# Patient Record
Sex: Female | Born: 1950 | Race: White | Hispanic: No | Marital: Married | Smoking: Never smoker
Health system: Southern US, Community
[De-identification: ages and names within clinical notes are randomized; demographics above are authoritative.]

## PROBLEM LIST (undated history)

## (undated) DIAGNOSIS — E785 Hyperlipidemia, unspecified: Secondary | ICD-10-CM

## (undated) DIAGNOSIS — F419 Anxiety disorder, unspecified: Secondary | ICD-10-CM

## (undated) HISTORY — DX: Hyperlipidemia, unspecified: E78.5

## (undated) HISTORY — DX: Anxiety disorder, unspecified: F41.9

## (undated) HISTORY — PX: HERNIA REPAIR: SHX51

---

## 1997-12-10 ENCOUNTER — Other Ambulatory Visit: Admission: RE | Admit: 1997-12-10 | Discharge: 1997-12-10 | Payer: Self-pay | Admitting: *Deleted

## 1998-06-23 ENCOUNTER — Other Ambulatory Visit: Admission: RE | Admit: 1998-06-23 | Discharge: 1998-06-23 | Payer: Self-pay | Admitting: *Deleted

## 1999-06-08 ENCOUNTER — Other Ambulatory Visit: Admission: RE | Admit: 1999-06-08 | Discharge: 1999-06-08 | Payer: Self-pay | Admitting: *Deleted

## 2000-06-07 ENCOUNTER — Other Ambulatory Visit: Admission: RE | Admit: 2000-06-07 | Discharge: 2000-06-07 | Payer: Self-pay | Admitting: *Deleted

## 2000-06-23 ENCOUNTER — Encounter: Admission: RE | Admit: 2000-06-23 | Discharge: 2000-06-23 | Payer: Self-pay | Admitting: *Deleted

## 2000-06-23 ENCOUNTER — Encounter: Payer: Self-pay | Admitting: *Deleted

## 2000-08-11 ENCOUNTER — Ambulatory Visit (HOSPITAL_COMMUNITY): Admission: RE | Admit: 2000-08-11 | Discharge: 2000-08-11 | Payer: Self-pay | Admitting: Gastroenterology

## 2001-06-20 ENCOUNTER — Other Ambulatory Visit: Admission: RE | Admit: 2001-06-20 | Discharge: 2001-06-20 | Payer: Self-pay | Admitting: *Deleted

## 2002-08-21 ENCOUNTER — Other Ambulatory Visit: Admission: RE | Admit: 2002-08-21 | Discharge: 2002-08-21 | Payer: Self-pay | Admitting: Obstetrics and Gynecology

## 2003-09-15 ENCOUNTER — Other Ambulatory Visit: Admission: RE | Admit: 2003-09-15 | Discharge: 2003-09-15 | Payer: Self-pay | Admitting: Obstetrics and Gynecology

## 2003-10-13 ENCOUNTER — Encounter: Admission: RE | Admit: 2003-10-13 | Discharge: 2003-10-13 | Payer: Self-pay | Admitting: Obstetrics and Gynecology

## 2005-09-16 ENCOUNTER — Encounter: Admission: RE | Admit: 2005-09-16 | Discharge: 2005-09-16 | Payer: Self-pay | Admitting: Obstetrics and Gynecology

## 2006-11-09 ENCOUNTER — Encounter: Admission: RE | Admit: 2006-11-09 | Discharge: 2006-11-09 | Payer: Self-pay | Admitting: Obstetrics and Gynecology

## 2007-11-12 ENCOUNTER — Encounter: Admission: RE | Admit: 2007-11-12 | Discharge: 2007-11-12 | Payer: Self-pay | Admitting: Obstetrics and Gynecology

## 2008-11-12 ENCOUNTER — Encounter: Admission: RE | Admit: 2008-11-12 | Discharge: 2008-11-12 | Payer: Self-pay | Admitting: Obstetrics and Gynecology

## 2009-11-18 ENCOUNTER — Encounter: Admission: RE | Admit: 2009-11-18 | Discharge: 2009-11-18 | Payer: Self-pay | Admitting: Obstetrics and Gynecology

## 2010-10-25 ENCOUNTER — Other Ambulatory Visit: Payer: Self-pay | Admitting: Obstetrics and Gynecology

## 2010-10-25 DIAGNOSIS — Z1231 Encounter for screening mammogram for malignant neoplasm of breast: Secondary | ICD-10-CM

## 2010-11-22 ENCOUNTER — Ambulatory Visit
Admission: RE | Admit: 2010-11-22 | Discharge: 2010-11-22 | Disposition: A | Discharge status: Home / Self Care | Payer: BC Managed Care – PPO | Source: Ambulatory Visit | Attending: Obstetrics and Gynecology | Admitting: Obstetrics and Gynecology

## 2010-11-22 DIAGNOSIS — Z1231 Encounter for screening mammogram for malignant neoplasm of breast: Secondary | ICD-10-CM

## 2010-11-26 ENCOUNTER — Other Ambulatory Visit: Payer: Self-pay | Admitting: Obstetrics and Gynecology

## 2010-12-31 NOTE — Procedures (Signed)
[facility]  Patient:    Kim Boyle, Kim Boyle                   MRN: [id number] Proc. Date: 08/11/00 Attending:  Verlin Grills, M.D. CC:         Sung Amabile. Roslyn Smiling, M.D.   Procedure Report  PROCEDURE:  Colonoscopy.  REFERRING PHYSICIAN:  Dr. Elissa Hefty.  INDICATION FOR PROCEDURE:  Kim Boyle (DOB: 06/15/1951) is a 60 year old female. Her mother was diagnosed with and died of colon cancer at approximately age 69. Kim Boyle is due for a surveillance colonoscopy with polypectomy to prevent colon cancer.  Kim Boyle viewed our colonoscopy education film in my office. I discussed with her the complications associated with colonoscopy and polypectomy including a 08/998 risk of bleeding and 11/998 risk of intestinal perforation requiring surgical repair. Kim Boyle has signed the operative permit.  ALLERGIES:  NOVOCAINE, CODEINE, PENICILLIN, GUIAFENESIN.  CHRONIC MEDICATIONS:  Tylenol, Xanax, calcium, magnesium, zinc, vitamin B6, vitamin E, acidophilus, Monistat.  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 Infectious mononucleosis.  FAMILY HISTORY:  Father age 63 is in good health. Mother died age 24 with colon cancer. Brother age 59 and sister age 56.  SOCIAL HISTORY:  Kim Boyle is married. Her 62 year old husband is in good health. Her sons ages 26 and 33 are in excellent health.  ENDOSCOPIST:  Verlin Grills, M.D.  PREMEDICATION:  Fentanyl 75 mcg, Versed 9 mg.  ENDOSCOPE:  Olympus pediatric colonoscope.  DESCRIPTION OF PROCEDURE:  After obtaining informed consent, the patient was placed in the left lateral decubitus position. I administered intravenous fentanyl and intravenous Versed to achieve conscious sedation for the procedure. The patients cardiac rhythm, oxygen saturation and blood pressure were monitored throughout the procedure and documented in the medical  record.  Anal inspection was normal. Digital rectal exam was normal. The Olympus pediatric video colonoscope was introduced into the rectum and under direct vision advanced to the cecum as identified by a normal appearing ileocecal valve. The ileocecal valve was intubated and the distal ileum inspected. Colonic preparation for the exam today was excellent.  RECTUM:  Normal.  SIGMOID COLON/DESCENDING COLON:  Normal.  SPLENIC FLEXURE:  Normal.  TRANSVERSE COLON:  Normal.  HEPATIC FLEXURE:  Normal.  ASCENDING COLON:  Normal.  CECUM/ILEOCECAL VALVE:  Normal.  DISTAL ILEUM:  Normal.  ASSESSMENT:  Normal proctocolonoscopy to the cecum with intubation of the ileocecal valve and distal ileal inspection.  RECOMMENDATIONS:  Repeat surveillance colonoscopy in 5 years. DD:  08/11/00 TD:  08/11/00 Job: [id number]

## 2011-01-25 ENCOUNTER — Other Ambulatory Visit: Payer: BC Managed Care – PPO

## 2011-02-04 ENCOUNTER — Ambulatory Visit
Admission: RE | Admit: 2011-02-04 | Discharge: 2011-02-04 | Disposition: A | Discharge status: Home / Self Care | Payer: BC Managed Care – PPO | Source: Ambulatory Visit | Attending: Obstetrics and Gynecology | Admitting: Obstetrics and Gynecology

## 2011-10-20 ENCOUNTER — Other Ambulatory Visit: Payer: Self-pay | Admitting: Obstetrics and Gynecology

## 2011-10-20 DIAGNOSIS — Z1231 Encounter for screening mammogram for malignant neoplasm of breast: Secondary | ICD-10-CM

## 2011-11-23 ENCOUNTER — Ambulatory Visit
Admission: RE | Admit: 2011-11-23 | Discharge: 2011-11-23 | Disposition: A | Discharge status: Home / Self Care | Payer: BC Managed Care – PPO | Source: Ambulatory Visit | Attending: Obstetrics and Gynecology | Admitting: Obstetrics and Gynecology

## 2011-11-23 ENCOUNTER — Ambulatory Visit: Payer: BC Managed Care – PPO

## 2011-11-23 DIAGNOSIS — Z1231 Encounter for screening mammogram for malignant neoplasm of breast: Secondary | ICD-10-CM

## 2012-10-16 ENCOUNTER — Other Ambulatory Visit: Payer: Self-pay

## 2012-10-16 DIAGNOSIS — Z1231 Encounter for screening mammogram for malignant neoplasm of breast: Secondary | ICD-10-CM

## 2012-11-28 ENCOUNTER — Ambulatory Visit
Admission: RE | Admit: 2012-11-28 | Discharge: 2012-11-28 | Disposition: A | Discharge status: Home / Self Care | Payer: BC Managed Care – PPO | Source: Ambulatory Visit

## 2012-11-28 DIAGNOSIS — Z1231 Encounter for screening mammogram for malignant neoplasm of breast: Secondary | ICD-10-CM

## 2012-12-04 ENCOUNTER — Other Ambulatory Visit: Payer: Self-pay | Admitting: Obstetrics and Gynecology

## 2012-12-04 DIAGNOSIS — M810 Age-related osteoporosis without current pathological fracture: Secondary | ICD-10-CM

## 2013-02-04 ENCOUNTER — Other Ambulatory Visit: Payer: BC Managed Care – PPO

## 2013-02-12 ENCOUNTER — Ambulatory Visit
Admission: RE | Admit: 2013-02-12 | Discharge: 2013-02-12 | Disposition: A | Discharge status: Home / Self Care | Payer: BC Managed Care – PPO | Source: Ambulatory Visit | Attending: Obstetrics and Gynecology | Admitting: Obstetrics and Gynecology

## 2013-02-12 DIAGNOSIS — M810 Age-related osteoporosis without current pathological fracture: Secondary | ICD-10-CM

## 2013-12-13 ENCOUNTER — Other Ambulatory Visit: Payer: Self-pay

## 2013-12-13 DIAGNOSIS — Z1231 Encounter for screening mammogram for malignant neoplasm of breast: Secondary | ICD-10-CM

## 2013-12-23 ENCOUNTER — Ambulatory Visit: Payer: BC Managed Care – PPO

## 2013-12-24 ENCOUNTER — Ambulatory Visit: Payer: BC Managed Care – PPO

## 2013-12-25 ENCOUNTER — Ambulatory Visit
Admission: RE | Admit: 2013-12-25 | Discharge: 2013-12-25 | Disposition: A | Discharge status: Home / Self Care | Payer: BC Managed Care – PPO | Source: Ambulatory Visit

## 2013-12-25 DIAGNOSIS — Z1231 Encounter for screening mammogram for malignant neoplasm of breast: Secondary | ICD-10-CM

## 2015-02-06 ENCOUNTER — Other Ambulatory Visit: Payer: Self-pay | Admitting: Obstetrics and Gynecology

## 2015-02-06 DIAGNOSIS — M810 Age-related osteoporosis without current pathological fracture: Secondary | ICD-10-CM

## 2015-02-17 ENCOUNTER — Ambulatory Visit
Admission: RE | Admit: 2015-02-17 | Discharge: 2015-02-17 | Disposition: A | Discharge status: Home / Self Care | Payer: BC Managed Care – PPO | Source: Ambulatory Visit | Attending: Obstetrics and Gynecology | Admitting: Obstetrics and Gynecology

## 2015-02-17 DIAGNOSIS — M810 Age-related osteoporosis without current pathological fracture: Secondary | ICD-10-CM

## 2017-02-16 ENCOUNTER — Other Ambulatory Visit: Payer: Self-pay | Admitting: Obstetrics and Gynecology

## 2017-02-16 DIAGNOSIS — M810 Age-related osteoporosis without current pathological fracture: Secondary | ICD-10-CM

## 2017-02-21 ENCOUNTER — Ambulatory Visit
Admission: RE | Admit: 2017-02-21 | Discharge: 2017-02-21 | Disposition: A | Discharge status: Home / Self Care | Payer: BC Managed Care – PPO | Source: Ambulatory Visit | Attending: Obstetrics and Gynecology | Admitting: Obstetrics and Gynecology

## 2017-02-21 DIAGNOSIS — M810 Age-related osteoporosis without current pathological fracture: Secondary | ICD-10-CM

## 2019-02-22 ENCOUNTER — Other Ambulatory Visit: Payer: Self-pay | Admitting: Physician Assistant

## 2019-02-22 DIAGNOSIS — Z780 Asymptomatic menopausal state: Secondary | ICD-10-CM

## 2019-02-22 DIAGNOSIS — E559 Vitamin D deficiency, unspecified: Secondary | ICD-10-CM

## 2019-04-08 ENCOUNTER — Other Ambulatory Visit: Payer: Self-pay | Admitting: Physician Assistant

## 2019-04-08 DIAGNOSIS — E559 Vitamin D deficiency, unspecified: Secondary | ICD-10-CM

## 2019-04-08 DIAGNOSIS — M810 Age-related osteoporosis without current pathological fracture: Secondary | ICD-10-CM

## 2019-04-10 ENCOUNTER — Other Ambulatory Visit: Payer: Self-pay

## 2019-04-10 ENCOUNTER — Ambulatory Visit
Admission: RE | Admit: 2019-04-10 | Discharge: 2019-04-10 | Disposition: A | Discharge status: Home / Self Care | Payer: Medicare Other | Source: Ambulatory Visit | Attending: Physician Assistant | Admitting: Physician Assistant

## 2019-04-10 DIAGNOSIS — E559 Vitamin D deficiency, unspecified: Secondary | ICD-10-CM

## 2019-04-10 DIAGNOSIS — M810 Age-related osteoporosis without current pathological fracture: Secondary | ICD-10-CM

## 2019-10-01 DIAGNOSIS — C4491 Basal cell carcinoma of skin, unspecified: Secondary | ICD-10-CM

## 2019-10-01 HISTORY — DX: Basal cell carcinoma of skin, unspecified: C44.91

## 2019-12-19 ENCOUNTER — Encounter: Payer: Self-pay | Admitting: *Deleted

## 2020-01-14 ENCOUNTER — Ambulatory Visit: Payer: Medicare Other | Admitting: Physician Assistant

## 2020-01-28 ENCOUNTER — Encounter: Payer: Self-pay | Admitting: Physician Assistant

## 2020-01-28 ENCOUNTER — Ambulatory Visit: Payer: Medicare PPO | Admitting: Physician Assistant

## 2020-01-28 ENCOUNTER — Other Ambulatory Visit: Payer: Self-pay

## 2020-01-28 DIAGNOSIS — Z85828 Personal history of other malignant neoplasm of skin: Secondary | ICD-10-CM

## 2020-01-28 DIAGNOSIS — Z1283 Encounter for screening for malignant neoplasm of skin: Secondary | ICD-10-CM | POA: Diagnosis not present

## 2020-01-28 NOTE — Progress Notes (Signed)
   Follow-Up Visit   Subjective  Kim Boyle is a 69 y.o. female who presents for the following: Follow-up (3 month f/u- left nare (MOHS)).  The following portions of the chart were reviewed this encounter and updated as appropriate:     Objective  Well appearing patient in no apparent distress; mood and affect are within normal limits.  A full examination was performed including scalp, head, eyes, ears, nose, lips, neck, chest, axillae, abdomen, back, buttocks, bilateral upper extremities, bilateral lower extremities, hands, feet, fingers, toes, fingernails, and toenails. All findings within normal limits unless otherwise noted below.  Objective  Left Alar Crease: Slightly tan graft left nare.  Objective  face: Face exam.  Assessment & Plan  History of basal cell carcinoma (BCC) Left Alar Crease  Yearly skin exam  Screening exam for skin cancer face  Yearly skin exams

## 2020-01-28 NOTE — Progress Notes (Signed)
   Follow-Up Visit   Subjective  Kim Boyle is a 69 y.o. female who presents for the following: Follow-up (3 month f/u- left nare (MOHS)).    The following portions of the chart were reviewed this encounter and updated as appropriate:     Objective  Well appearing patient in no apparent distress; mood and affect are within normal limits.  A focused examination was performed including face and neck. Relevant physical exam findings are noted in the Assessment and Plan.  Objective  Left Alar Crease: Slightly tan graft left nare.  Objective  face: Face exam.   Assessment & Plan  History of basal cell carcinoma (BCC) Left Alar Crease  Yearly skin exam  Screening exam for skin cancer face  Yearly skin exams

## 2020-01-28 NOTE — Addendum Note (Signed)
Addended by: Robyne Askew R on: 01/28/2020 04:54 PM   Modules accepted: Level of Service

## 2020-04-24 ENCOUNTER — Encounter: Payer: Self-pay | Admitting: Physician Assistant

## 2020-04-24 ENCOUNTER — Ambulatory Visit: Payer: Medicare PPO | Admitting: Physician Assistant

## 2020-04-24 ENCOUNTER — Other Ambulatory Visit: Payer: Self-pay

## 2020-04-24 DIAGNOSIS — L570 Actinic keratosis: Secondary | ICD-10-CM

## 2020-04-24 NOTE — Progress Notes (Signed)
   Follow-Up Visit   Subjective  Kim Boyle is a 69 y.o. female who presents for the following: Skin Problem (check 1 spot on the nose--noticed for 2 months).   The following portions of the chart were reviewed this encounter and updated as appropriate:     Objective  Well appearing patient in no apparent distress; mood and affect are within normal limits.  A focused examination was performed including faxcw and back. Relevant physical exam findings are noted in the Assessment and Plan.  Objective  Mid Tip of Nose, Right Supraclavicular Area: Erythematous patches with gritty scale.   Assessment & Plan  AK (actinic keratosis) (2) Mid Tip of Nose; Right Supraclavicular Area  Destruction of lesion - Mid Tip of Nose, Right Supraclavicular Area Complexity: simple   Destruction method: cryotherapy   Informed consent: discussed and consent obtained   Timeout:  patient name, date of birth, surgical site, and procedure verified Lesion destroyed using liquid nitrogen: Yes   Cryotherapy cycles:  1 Outcome: patient tolerated procedure well with no complications   Post-procedure details: wound care instructions given      I, Shunda Rabadi, PA-C, have reviewed all documentation's for this visit.  The documentation on 04/24/20 for the exam, diagnosis, procedures and orders are all accurate and complete.

## 2020-09-29 ENCOUNTER — Ambulatory Visit: Payer: Medicare PPO | Admitting: Physician Assistant

## 2020-10-20 ENCOUNTER — Encounter: Payer: Self-pay | Admitting: Physician Assistant

## 2020-10-20 ENCOUNTER — Other Ambulatory Visit: Payer: Self-pay

## 2020-10-20 ENCOUNTER — Ambulatory Visit: Payer: Medicare PPO | Admitting: Physician Assistant

## 2020-10-20 DIAGNOSIS — D1800 Hemangioma unspecified site: Secondary | ICD-10-CM | POA: Diagnosis not present

## 2020-10-20 DIAGNOSIS — Z85828 Personal history of other malignant neoplasm of skin: Secondary | ICD-10-CM

## 2020-10-20 DIAGNOSIS — L570 Actinic keratosis: Secondary | ICD-10-CM | POA: Diagnosis not present

## 2020-10-20 DIAGNOSIS — Z1283 Encounter for screening for malignant neoplasm of skin: Secondary | ICD-10-CM | POA: Diagnosis not present

## 2020-10-20 DIAGNOSIS — L910 Hypertrophic scar: Secondary | ICD-10-CM

## 2020-10-20 DIAGNOSIS — L821 Other seborrheic keratosis: Secondary | ICD-10-CM | POA: Diagnosis not present

## 2020-10-20 MED ORDER — PANDEL 0.1 % EX CREA: 1.0000 "application " | TOPICAL_CREAM | Freq: Every morning | CUTANEOUS | 0 refills | Status: AC

## 2020-10-20 NOTE — Patient Instructions (Signed)
Dr Dossie Der- rheumatologist

## 2020-11-03 ENCOUNTER — Encounter: Payer: Self-pay | Admitting: Physician Assistant

## 2020-11-03 NOTE — Progress Notes (Signed)
   Follow-Up Visit   Subjective  Kim Boyle is a 70 y.o. female who presents for the following: Annual Exam (Patient here today for yearly skin check. Per patient she would like to have the [facility] site checked on the left side of her nose, check spot on right hip x unsure per patient that spot doesn't itch or bleed it's just dry. ).   The following portions of the chart were reviewed this encounter and updated as appropriate:  Tobacco  Allergies  Meds  Problems  Med Hx  Surg Hx  Fam Hx      Objective  Well appearing patient in no apparent distress; mood and affect are within normal limits.  A full examination was performed including scalp, head, eyes, ears, nose, lips, neck, chest, axillae, abdomen, back, buttocks, bilateral upper extremities, bilateral lower extremities, hands, feet, fingers, toes, fingernails, and toenails. All findings within normal limits unless otherwise noted below.  Objective  Head to Toe: No atypical nevi No signs of non-mole skin cancer. Full body skin examination-   Objective  Left Nare: White scar- clear  Objective  Right Hip: Multiple Stuck-on, waxy, tan-brown papules and plaques. --Discussed benign etiology and prognosis.   Objective  Left Flank: Multiple red raised papules  Objective  Left Upper Cutaneous Lip, Philtrum: Erythematous patches with gritty scale.  Objective  Left Nasofacial Sulcus: Firm pink dermal plaque c/w keloid.    Assessment & Plan  Skin exam for malignant neoplasm Head to Toe  Yearly skin check  History of basal cell carcinoma (BCC) Left Nare  Yearly skin check  Seborrheic keratosis Right Hip  Okay to leave if stable  Hemangioma, unspecified site Left Flank  Okay to leave if stable  AK (actinic keratosis) (2) Left Upper Cutaneous Lip; Philtrum  Destruction of lesion - Left Upper Cutaneous Lip, Philtrum Complexity: simple   Destruction method: cryotherapy   Informed consent: discussed and  consent obtained   Timeout:  patient name, date of birth, surgical site, and procedure verified Lesion destroyed using liquid nitrogen: Yes   Cryotherapy cycles:  3 Outcome: patient tolerated procedure well with no complications    Keloid Left Nasofacial Sulcus  3 month f/u if no better  Hydrocortisone Probutate (PANDEL) 0.1 % CREA - Left Nasofacial Sulcus    I, Araya Roel, PA-C, have reviewed all documentation's for this visit.  The documentation on 11/03/20 for the exam, diagnosis, procedures and orders are all accurate and complete.

## 2021-01-20 ENCOUNTER — Other Ambulatory Visit: Payer: Self-pay

## 2021-01-20 ENCOUNTER — Encounter: Payer: Self-pay | Admitting: Physician Assistant

## 2021-01-20 ENCOUNTER — Ambulatory Visit (INDEPENDENT_AMBULATORY_CARE_PROVIDER_SITE_OTHER): Payer: Medicare PPO | Admitting: Physician Assistant

## 2021-01-20 DIAGNOSIS — Z808 Family history of malignant neoplasm of other organs or systems: Secondary | ICD-10-CM

## 2021-01-20 DIAGNOSIS — L570 Actinic keratosis: Secondary | ICD-10-CM

## 2021-01-20 DIAGNOSIS — Z85828 Personal history of other malignant neoplasm of skin: Secondary | ICD-10-CM

## 2021-01-20 NOTE — Progress Notes (Signed)
   Follow-Up Visit   Subjective  Kim Boyle is a 70 y.o. female who presents for the following: Follow-up (Patient here today for 3 month follow from LN2 last visit per patient everything healed well. Personal history and family history of non mole skin cancer. No personal history or family history of atypical moles or melanoma. ).   The following portions of the chart were reviewed this encounter and updated as appropriate:  Tobacco  Allergies  Meds  Problems  Med Hx  Surg Hx  Fam Hx      Objective  Well appearing patient in no apparent distress; mood and affect are within normal limits.  A focused examination was performed including face. Relevant physical exam findings are noted in the Assessment and Plan.  Objective  Mid Forehead: Erythematous patches with gritty scale.   Assessment & Plan  AK (actinic keratosis) Mid Forehead  Destruction of lesion - Mid Forehead Complexity: simple   Destruction method: cryotherapy   Informed consent: discussed and consent obtained   Timeout:  patient name, date of birth, surgical site, and procedure verified Lesion destroyed using liquid nitrogen: Yes   Cryotherapy cycles:  3 Outcome: patient tolerated procedure well with no complications   Post-procedure details: wound care instructions given    Upper lip- AK diagnosed was frozen with LN2 last visit. Smooth today. Return to clinic if recurs.  I, Jaden Abreu, PA-C, have reviewed all documentation's for this visit.  The documentation on 01/20/21 for the exam, diagnosis, procedures and orders are all accurate and complete.

## 2021-05-12 ENCOUNTER — Ambulatory Visit: Payer: Medicare PPO | Admitting: Physician Assistant

## 2021-05-12 ENCOUNTER — Encounter: Payer: Self-pay | Admitting: Physician Assistant

## 2021-05-12 ENCOUNTER — Other Ambulatory Visit: Payer: Self-pay

## 2021-05-12 DIAGNOSIS — L119 Acantholytic disorder, unspecified: Secondary | ICD-10-CM | POA: Diagnosis not present

## 2021-05-12 DIAGNOSIS — L570 Actinic keratosis: Secondary | ICD-10-CM

## 2021-05-12 DIAGNOSIS — D485 Neoplasm of uncertain behavior of skin: Secondary | ICD-10-CM

## 2021-05-12 NOTE — Patient Instructions (Signed)

## 2021-05-15 ENCOUNTER — Encounter: Payer: Self-pay | Admitting: Physician Assistant

## 2021-05-17 ENCOUNTER — Encounter: Payer: Self-pay | Admitting: Physician Assistant

## 2021-05-17 ENCOUNTER — Telehealth: Payer: Self-pay | Admitting: Physician Assistant

## 2021-05-17 NOTE — Telephone Encounter (Signed)
Said she got text + email to check mychart for results, but there was nothing there.

## 2021-05-17 NOTE — Telephone Encounter (Signed)
Pathology to patient.  °

## 2021-05-17 NOTE — Progress Notes (Addendum)
   Follow-Up Visit   Subjective  Kim Boyle is a 70 y.o. female who presents for the following: Skin Problem (Patient here today to have her upper mid lip rechecked that was previously LN2 in March. Per patient it's starting to get red and sore, she puts vitamin E on it and it goes away.  Patient also states there's a lesion on her back that her massage therapist told her needed to be checked. ).   The following portions of the chart were reviewed this encounter and updated as appropriate:  Tobacco  Allergies  Meds  Problems  Med Hx  Surg Hx  Fam Hx      Objective  Well appearing patient in no apparent distress; mood and affect are within normal limits.  A focused examination was performed including face. Relevant physical exam findings are noted in the Assessment and Plan.  Mid Upper Vermilion Lip Hyperkeratotic scale with pink base      Assessment & Plan  Neoplasm of uncertain behavior of skin Mid Upper Vermilion Lip  Skin / nail biopsy Type of biopsy: tangential   Informed consent: discussed and consent obtained   Timeout: patient name, date of birth, surgical site, and procedure verified   Procedure prep:  Patient was prepped and draped in usual sterile fashion (Non sterile) Prep type:  Chlorhexidine Anesthesia: the lesion was anesthetized in a standard fashion   Anesthetic:  1% lidocaine w/ epinephrine 1-100,000 local infiltration Instrument used: flexible razor blade   Outcome: patient tolerated procedure well   Post-procedure details: wound care instructions given    Specimen 1 - Surgical pathology Differential Diagnosis: bcc vs scc  Check Margins: No   I, Ryleigh Buenger, PA-C, have reviewed all documentation's for this visit.  The documentation on 05/17/21 for the exam, diagnosis, procedures and orders are all accurate and complete.

## 2021-06-20 LAB — COLOGUARD: COLOGUARD: NEGATIVE

## 2021-06-20 LAB — EXTERNAL GENERIC LAB PROCEDURE: COLOGUARD: NEGATIVE

## 2021-10-20 ENCOUNTER — Encounter: Payer: Self-pay | Admitting: Physician Assistant

## 2021-10-20 ENCOUNTER — Other Ambulatory Visit: Payer: Self-pay

## 2021-10-20 ENCOUNTER — Ambulatory Visit: Payer: Medicare PPO | Admitting: Physician Assistant

## 2021-10-20 DIAGNOSIS — Z85828 Personal history of other malignant neoplasm of skin: Secondary | ICD-10-CM

## 2021-10-20 DIAGNOSIS — Z808 Family history of malignant neoplasm of other organs or systems: Secondary | ICD-10-CM | POA: Diagnosis not present

## 2021-10-20 DIAGNOSIS — Z1283 Encounter for screening for malignant neoplasm of skin: Secondary | ICD-10-CM

## 2021-10-20 NOTE — Progress Notes (Signed)
? ?  Follow-Up Visit ?  ?Subjective  ?Kim Boyle is a 71 y.o. female who presents for the following: Annual Exam (Lesion on lip and nose- recheck- "dry". Personal history of non mole skin cancer. Family history of non mole skin cancers. ). ? ? ?The following portions of the chart were reviewed this encounter and updated as appropriate:  Tobacco  Allergies  Meds  Problems  Med Hx  Surg Hx  Fam Hx   ?  ? ?Objective  ?Well appearing patient in no apparent distress; mood and affect are within normal limits. ? ?A full examination was performed including scalp, head, eyes, ears, nose, lips, neck, chest, axillae, abdomen, back, buttocks, bilateral upper extremities, bilateral lower extremities, hands, feet, fingers, toes, fingernails, and toenails. All findings within normal limits unless otherwise noted below. ? ?Left upper lip and left nose stable after liquid nitrogen destruction. No atypical nevi or signs of NMSC noted at the time of the visit.  ? ? ?Assessment & Plan  ?Screening exam for skin cancer ? ?Yearly skin exams  ? ?No atypical nevi noted at the time of the visit. ? ?I, Adrie Picking, PA-C, have reviewed all documentation's for this visit.  The documentation on 10/20/21 for the exam, diagnosis, procedures and orders are all accurate and complete. ?

## 2021-12-27 ENCOUNTER — Telehealth: Payer: Self-pay | Admitting: *Deleted

## 2022-03-22 ENCOUNTER — Ambulatory Visit: Payer: Medicare PPO | Admitting: Physician Assistant

## 2022-10-25 ENCOUNTER — Ambulatory Visit: Payer: Medicare PPO | Admitting: Physician Assistant

## 2023-11-06 NOTE — Progress Notes (Signed)
 [facility] [phone]   [phone]  INITIAL CONSULT NOTE  Patient Care Team: Exie Parody as PCP - General (Physician Assistant) Glyn Ade, PA-C as Physician Assistant (Dermatology)  Hematological/Oncological History Labs from PCP, Betsey Holiday PA-C 09/29/2023: WBC 8.3, Hgb 13.0, Plt 413, Abs Lymph 2.5 (H) 11/07/2023: Establ[facility] care with [facility]  CHIEF COMPLAINTS/PURPOSE OF CONSULTATION:  Lymphocytosis  HISTORY OF PRESENTING ILLNESS:  Kim Boyle 73 y.o. female with medical history significant for ***  On review of the previous records ***  On exam today ***  MEDICAL HISTORY:  Past Medical History:  Diagnosis Date   Basal cell carcinoma 10/01/2019   nodular left nare-mohs    SURGICAL HISTORY: No past surgical history on file.  SOCIAL HISTORY: Social History   Socioeconomic History   Marital status: Married    Spouse name: Not on file   Number of children: Not on file   Years of education: Not on file   Highest education level: Not on file  Occupational History   Not on file  Tobacco Use   Smoking status: Never   Smokeless tobacco: Never  Substance and Sexual Activity   Alcohol use: Never   Drug use: Never   Sexual activity: Not on file  Other Topics Concern   Not on file  Social History Narrative   Not on file   Social Drivers of Health   Financial Resource Strain: Low Risk  (04/25/2023)   Received from [facility]   Overall Financial Resource Strain (CARDIA)    Difficulty of Paying Living Expenses: Not hard at all  Food Insecurity: No Food Insecurity (04/25/2023)   Received from [facility]   Hunger Vital Sign    Worried About Running Out of Food in the Last Year: Never true    Ran Out of Food in the Last Year: Never true  Transportation Needs: No Transportation Needs (04/25/2023)   Received from [facility] - Transportation    Lack of Transportation  (Medical): No    Lack of Transportation (Non-Medical): No  Physical Activity: Sufficiently Active (04/25/2023)   Received from [facility]   Exercise Vital Sign    Days of Exercise per Week: 4 days    Minutes of Exercise per Session: 60 min  Stress: Stress Concern Present (05/16/2023)   Received from [facility] - Occupational Stress Questionnaire    Feeling of Stress : Rather much  Social Connections: Socially Integrated (04/25/2023)   Received from [facility]   Social Network    How would you rate your social network (family, work, friends)?: Good participation with social networks  Intimate Partner Violence: Not At Risk (04/25/2023)   Received from [facility]   HITS    Over the last 12 months how often did your partner physically hurt you?: Never    Over the last 12 months how often did your partner insult you or talk down to you?: Rarely    Over the last 12 months how often did your partner threaten you with physical harm?: Never    Over the last 12 months how often did your partner scream or curse at you?: Never    FAMILY HISTORY: No family history on file.  ALLERGIES:  is allergic to pyrethrins, bee venom, codeine, guaifenesin & derivatives, penicillins, shellfish-derived products, and sulfamethoxazole-trimethoprim.  MEDICATIONS:  Current Outpatient Medications  Medication Sig Dispense Refill   acetaminophen (TYLENOL) 500  MG tablet Take by mouth.     alendronate (FOSAMAX) 70 MG tablet 1 tablet 30 minutes before the first food, beverage or medicine of the day with plain water     ALPRAZolam (XANAX) 0.5 MG tablet Take 0.5 mg by mouth at bedtime as needed for anxiety.     ascorbic acid (VITAMIN C) 100 MG tablet Take by mouth.     calcium carbonate (OS-CAL) 1250 (500 Ca) MG chewable tablet Chew by mouth.     Cholecalciferol 50 MCG (2000 UT) CAPS Take 2 capsules by mouth daily.     conjugated estrogens (PREMARIN) vaginal cream       diclofenac Sodium (VOLTAREN) 1 % GEL Apply topically.     EPINEPHrine 0.3 mg/0.3 mL IJ SOAJ injection INJECT 0.3 MILLILITERS INTRAMUSCULARLY AS NEEDED FOR ANAPHYLAXIS     erythromycin ophthalmic ointment SMARTSIG:Sparingly In Eye(s) As Directed     estradiol (ESTRACE) 0.1 MG/GM vaginal cream      FLUoxetine (PROZAC) 20 MG capsule 1 tablet     Hydrocortisone Probutate (PANDEL) 0.1 % CREA Apply 1 application topically in the morning. 2 g 0   levocetirizine (XYZAL) 5 MG tablet Take 5 mg by mouth every evening.     Multiple Vitamins-Minerals (OCUVITE EXTRA) TABS      triamcinolone (KENALOG) 0.1 % APPLY TO AFFECTED AREA TWICE A DAY     No current facility-administered medications for this visit.    REVIEW OF SYSTEMS:   Constitutional: ( - ) fevers, ( - )  chills , ( - ) night sweats Eyes: ( - ) blurriness of vision, ( - ) double vision, ( - ) watery eyes Ears, nose, mouth, throat, and face: ( - ) mucositis, ( - ) sore throat Respiratory: ( - ) cough, ( - ) dyspnea, ( - ) wheezes Cardiovascular: ( - ) palpitation, ( - ) chest discomfort, ( - ) lower extremity swelling Gastrointestinal:  ( - ) nausea, ( - ) heartburn, ( - ) change in bowel habits Skin: ( - ) abnormal skin rashes Lymphatics: ( - ) new lymphadenopathy, ( - ) easy bruising Neurological: ( - ) numbness, ( - ) tingling, ( - ) new weaknesses Behavioral/Psych: ( - ) mood change, ( - ) new changes  All other systems were reviewed with the patient and are negative.  PHYSICAL EXAMINATION: ECOG PERFORMANCE STATUS: {CHL ONC ECOG PS:[id number]}  There were no vitals filed for this visit. There were no vitals filed for this visit.  GENERAL: well appearing *** in NAD  SKIN: skin color, texture, turgor are normal, no rashes or significant lesions EYES: conjunctiva are pink and non-injected, sclera clear OROPHARYNX: no exudate, no erythema; lips, buccal mucosa, and tongue normal  NECK: supple, non-tender LYMPH:  no palpable  lymphadenopathy in the cervical, axillary or supraclavicular lymph nodes.  LUNGS: clear to auscultation and percussion with normal breathing effort HEART: regular rate & rhythm and no murmurs and no lower extremity edema ABDOMEN: soft, non-tender, non-distended, normal bowel sounds Musculoskeletal: no cyanosis of digits and no clubbing  PSYCH: alert & oriented x 3, fluent speech NEURO: no focal motor/sensory deficits  LABORATORY DATA:  I have reviewed the data as listed     No data to display              No data to display           PATHOLOGY: ***  BLOOD FILM: *** Review of the peripheral blood smear showed normal appearing white cells  with neutrophils that were appropriately lobated and granulated. There was no predominance of bi-lobed or hyper-segmented neutrophils appreciated. No Dohle bodies were noted. There was no left shifting, immature forms or blasts noted. Lymphocytes remain normal in size without any predominance of large granular lymphocytes. Red cells show no anisopoikilocytosis, macrocytes , microcytes or polychromasia. There were no schistocytes, target cells, echinocytes, acanthocytes, dacrocytes, or stomatocytes.There was no rouleaux formation, nucleated red cells, or intra-cellular inclusions noted. The platelets are normal in size, shape, and color without any clumping evident.  RADIOGRAPHIC STUDIES: I have personally reviewed the radiological images as listed and agreed with the findings in the report. No results found.  ASSESSMENT & PLAN ***  No orders of the defined types were placed in this encounter.   All questions were answered. The patient knows to call the clinic with any problems, questions or concerns.  I have spent a total of {CHL ONC TIME VISIT - [id number]} minutes of face-to-face and non-face-to-face time, preparing to see the patient, obtaining and/or reviewing separately obtained history, performing a medically appropriate  examination, counseling and educating the patient, ordering medications/tests/procedures, referring and communicating with other health care professionals, documenting clinical information in the electronic health record, independently interpreting results and communicating results to the patient, and care coordination.   Georga Kaufmann, PA-C Department of Hematology/Oncology [facility] at [facility] Phone: [phone]

## 2023-11-07 ENCOUNTER — Inpatient Hospital Stay: Attending: Physician Assistant | Admitting: Physician Assistant

## 2023-11-07 ENCOUNTER — Inpatient Hospital Stay

## 2023-11-07 ENCOUNTER — Telehealth: Payer: Self-pay | Admitting: Physician Assistant

## 2023-11-07 ENCOUNTER — Encounter: Payer: Self-pay | Admitting: Physician Assistant

## 2023-11-07 VITALS — BP 130/70 | HR 72 | Temp 97.7°F | Resp 16 | Ht 67.5 in | Wt 146.7 lb

## 2023-11-07 DIAGNOSIS — D72820 Lymphocytosis (symptomatic): Secondary | ICD-10-CM | POA: Diagnosis present

## 2023-11-07 DIAGNOSIS — F419 Anxiety disorder, unspecified: Secondary | ICD-10-CM | POA: Insufficient documentation

## 2023-11-07 DIAGNOSIS — Z85828 Personal history of other malignant neoplasm of skin: Secondary | ICD-10-CM | POA: Insufficient documentation

## 2023-11-07 DIAGNOSIS — F32A Depression, unspecified: Secondary | ICD-10-CM | POA: Diagnosis not present

## 2023-11-07 DIAGNOSIS — E785 Hyperlipidemia, unspecified: Secondary | ICD-10-CM | POA: Diagnosis not present

## 2023-11-07 DIAGNOSIS — Z79899 Other long term (current) drug therapy: Secondary | ICD-10-CM | POA: Insufficient documentation

## 2023-11-07 DIAGNOSIS — Z809 Family history of malignant neoplasm, unspecified: Secondary | ICD-10-CM

## 2023-11-07 LAB — C-REACTIVE PROTEIN: CRP: 0.6 mg/dL (ref <1.0)

## 2023-11-07 LAB — CMP (CANCER CENTER ONLY)
ALT: 16 U/L (ref 0–44)
AST: 19 U/L (ref 15–41)
Albumin: 4.3 g/dL (ref 3.5–5.0)
Alkaline Phosphatase: 78 U/L (ref 38–126)
Anion gap: 6 (ref 5–15)
BUN: 14 mg/dL (ref 8–23)
CO2: 29 mmol/L (ref 22–32)
Calcium: 9.5 mg/dL (ref 8.9–10.3)
Chloride: 101 mmol/L (ref 98–111)
Creatinine: 0.59 mg/dL (ref 0.44–1.00)
GFR, Estimated: >60 mL/min (ref >60)
Glucose, Bld: 98 mg/dL (ref 70–99)
Potassium: 4.1 mmol/L (ref 3.5–5.1)
Sodium: 136 mmol/L (ref 135–145)
Total Bilirubin: 0.3 mg/dL (ref 0.0–1.2)
Total Protein: 7.3 g/dL (ref 6.5–8.1)

## 2023-11-07 LAB — CBC WITH DIFFERENTIAL (CANCER CENTER ONLY)
Abs Immature Granulocytes: 0.03 10*3/uL (ref 0.00–0.07)
Basophils Absolute: 0.1 10*3/uL (ref 0.0–0.1)
Basophils Relative: 1 %
Eosinophils Absolute: 0.4 10*3/uL (ref 0.0–0.5)
Eosinophils Relative: 4 %
HCT: 38.7 % (ref 36.0–46.0)
Hemoglobin: 12.7 g/dL (ref 12.0–15.0)
Immature Granulocytes: 0 %
Lymphocytes Relative: 40 %
Lymphs Abs: 4.3 10*3/uL — ABNORMAL HIGH (ref 0.7–4.0)
MCH: 28.3 pg (ref 26.0–34.0)
MCHC: 32.8 g/dL (ref 30.0–36.0)
MCV: 86.2 fL (ref 80.0–100.0)
Monocytes Absolute: 0.7 10*3/uL (ref 0.1–1.0)
Monocytes Relative: 7 %
Neutro Abs: 5.2 10*3/uL (ref 1.7–7.7)
Neutrophils Relative %: 48 %
Platelet Count: 372 10*3/uL (ref 150–400)
RBC: 4.49 MIL/uL (ref 3.87–5.11)
RDW: 13.1 % (ref 11.5–15.5)
WBC Count: 10.7 10*3/uL — ABNORMAL HIGH (ref 4.0–10.5)
nRBC: 0 % (ref 0.0–0.2)

## 2023-11-07 LAB — SURGICAL PATHOLOGY

## 2023-11-07 LAB — LACTATE DEHYDROGENASE: LDH: 144 U/L (ref 98–192)

## 2023-11-07 LAB — SEDIMENTATION RATE: Sed Rate: 11 mm/h (ref 0–22)

## 2023-11-07 NOTE — Telephone Encounter (Signed)
 Scheduled appointments per LOS notes. Left the patient a voicemail with the appointment details. The patient will be mailed an appointment reminder.

## 2023-11-09 LAB — FLOW CYTOMETRY

## 2023-12-26 ENCOUNTER — Encounter: Payer: Self-pay | Admitting: Genetic Counselor

## 2023-12-26 ENCOUNTER — Inpatient Hospital Stay: Attending: Physician Assistant | Admitting: Genetic Counselor

## 2023-12-26 ENCOUNTER — Inpatient Hospital Stay

## 2023-12-26 DIAGNOSIS — Z803 Family history of malignant neoplasm of breast: Secondary | ICD-10-CM

## 2023-12-26 DIAGNOSIS — Z800 Family history of malignant neoplasm of digestive organs: Secondary | ICD-10-CM

## 2023-12-26 DIAGNOSIS — Z1379 Encounter for other screening for genetic and chromosomal anomalies: Secondary | ICD-10-CM

## 2023-12-26 LAB — GENETIC SCREENING ORDER

## 2023-12-26 NOTE — Progress Notes (Addendum)
 REFERRING PROVIDER: Portia Brittle   PRIMARY PROVIDER:  Ventura Gins, PA-C  PRIMARY REASON FOR VISIT:  1. Family history of malignant neoplasm of breast   2. Family history of malignant neoplasm of gastrointestinal tract      HISTORY OF PRESENT ILLNESS:   Kim Boyle, a 73 y.o. female, was seen for a [place] cancer genetics consultation at the request of Darilyn Edin, PA-C  due to a family history of cancer.  [name] presents to clinic today to discuss the possibility of a hereditary predisposition to cancer, genetic testing, and to further clarify her future cancer risks, as well as potential cancer risks for family members.   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 She does not report any additional history of cancer. 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   RELEVANT MEDICAL HISTORY:  Menarche was at age 50.  First live birth at age 76.  Ovaries intact: yes.  Hysterectomy: no.  Menopausal status: postmenopausal. 53 HRT use: 0 years. Colonoscopy: yes; normal. Mammogram within the last year: yes. Number of breast biopsies: 0.   Past Medical History:  Diagnosis Date   Anxiety    Basal cell carcinoma 10/01/2019   nodular left nare-mohs   Hyperlipemia     Past Surgical History:  Procedure Laterality Date   HERNIA REPAIR      Social History   Socioeconomic History   Marital status: Married    Spouse name: Not on file   Number of children: Not on file   Years of education: Not on file   Highest education level: Not on file  Occupational History   Not on file  Tobacco Use   Smoking status: Never   Smokeless tobacco: Never  Substance and Sexual Activity   Alcohol use: Never   Drug use: Never   Sexual activity: Not on file  Other Topics Concern   Not on file  Social History Narrative   Not on file   Social Drivers of Health   Financial Resource Strain: Low Risk  (04/25/2023)   Received  from [facility]   Overall Financial Resource Strain (CARDIA)    Difficulty of Paying Living Expenses: Not hard at all  Food Insecurity: No Food Insecurity (04/25/2023)   Received from [facility]   Hunger Vital Sign    Worried About Running Out of Food in the Last Year: Never true    Ran Out of Food in the Last Year: Never true  Transportation Needs: No Transportation Needs (04/25/2023)   Received from [facility] - Transportation    Lack of Transportation (Medical): No    Lack of Transportation (Non-Medical): No  Physical Activity: Sufficiently Active (04/25/2023)   Received from [facility]   Exercise Vital Sign    Days of Exercise per Week: 4 days    Minutes of Exercise per Session: 60 min  Stress: Stress Concern Present (05/16/2023)   Received from [facility] - Occupational Stress Questionnaire    Feeling of Stress : Rather much  Social Connections: Socially Integrated (04/25/2023)   Received from [facility]   Social Network    How would you rate your social network (family, work, friends)?: Good participation with social networks     FAMILY HISTORY:  We obtained a detailed, 4-generation family history.  Significant diagnoses are listed below: Family History  Problem Relation Age of Onset  Colon cancer Mother 56   Breast cancer Sister 83   Heart attack Brother 23   Breast cancer Paternal Grandmother 54 - 59   Colon cancer Maternal Aunt 38    [name] is unaware of previous family history of genetic testing for hereditary cancer risks. [name] reports her sister was diagnosed with breast cancer at age 58, living at age 8. She reports her mother was diagnosed with colon cancer in her early 31s, passed away at age 18. She reports her maternal aunt was diagnosed with colon cancer at age 68, passed away at 39. She reports her paternal grandmother was diagnosed with breast cancer in her 30s. There is no  reported Ashkenazi Jewish ancestry.     GENETIC COUNSELING ASSESSMENT: [name] is a 72 y.o. female with a family history of cancer which is suggestive of an inherited predisposition to cancer given the family history of two close relatives with breast cancer diagnosed at or before age 60. We, therefore, discussed and recommended the following at today's visit.   DISCUSSION: We discussed that, in general, most cancer is not inherited in families, but instead is sporadic or familial. Sporadic cancers occur by chance and typically happen at older ages (>50 years) as this type of cancer is caused by genetic changes acquired during an individual's lifetime. Some families have more cancers than would be expected by chance; however, the ages or types of cancer are not consistent with a known genetic mutation or known genetic mutations have been ruled out. This type of familial cancer is thought to be due to a combination of multiple genetic, environmental, hormonal, and lifestyle factors. While this combination of factors likely increases the risk of cancer, the exact source of this risk is not currently identifiable or testable.  We discussed that 5 - 10% of cancer is hereditary. We discussed that testing is beneficial for several reasons including knowing how to screen individuals for cancer, identify preventative treatments, and to understand if other family members could be at risk for cancer and allow them to undergo genetic testing.   We reviewed the characteristics, features and inheritance patterns of hereditary cancer syndromes. We also discussed genetic testing, including the appropriate family members to test, the process of testing, insurance coverage and turn-around-time for results. We discussed the implications of a negative, positive, carrier and/or variant of uncertain significant result. [name]  was offered a common hereditary cancer panel (36+ genes) and an expanded pan-cancer panel  (70+ genes). [name] was informed of the benefits and limitations of each panel, including that expanded pan-cancer panels contain genes that do not have clear management guidelines at this point in time.  We also discussed that as the number of genes included on a panel increases, the chances of variants of uncertain significance increases. Ms. Budish decided to pursue genetic testing for the 40 gene panel. The [facility] CancerNext+RNAinsight Panel includes sequencing, rearrangement analysis, and RNA analysis for the following 40 genes: APC, ATM, BAP1, BARD1, BMPR1A, BRCA1, BRCA2, BRIP1, CDH1, CDKN2A, CHEK2, FH, FLCN, MET, MLH1, MSH2, MSH6, MUTYH, NF1, NTHL1, PALB2, PMS2, PTEN, RAD51C, RAD51D, RPS20, SMAD4, STK11, TP53, TSC1, TSC2 and VHL (sequencing and deletion/duplication); AXIN2, HOXB13, MBD4, MSH3, POLD1 and POLE (sequencing only); EPCAM and GREM1 (deletion/duplication only).    Based on Ms. Mattice's family history of cancer, she meets medical criteria for genetic testing. Though Ms. Santellano is not personally affected, there are no affected family members that are willing/able/available to undergo hereditary cancer testing.  Therefore, Ms. Hollandis the most informative family member available.  Despite that she meets criteria, she may still have an out of pocket cost. We discussed that if her out of pocket cost for testing is over $100, the laboratory will call and confirm whether she wants to proceed with testing.  If the out of pocket cost of testing is less than $100 she will be billed by the genetic testing laboratory.   We discussed that some people do not want to undergo genetic testing due to fear of genetic discrimination.  The Genetic Information Nondiscrimination Act (GINA) was signed into federal law in 2008. GINA prohibits health insurers and most employers from discriminating against individuals based on genetic information (including the results of genetic tests and family history  information). According to GINA, health insurance companies cannot consider genetic information to be a preexisting condition, nor can they use it to make decisions regarding coverage or rates. GINA also makes it illegal for most employers to use genetic information in making decisions about hiring, firing, promotion, or terms of employment. It is important to note that GINA does not offer protections for life insurance, disability insurance, or long-term care insurance. GINA does not apply to those in the [employer], those who work for companies with less than 15 employees, and new life insurance or long-term disability insurance policies.  Health status due to a cancer diagnosis is not protected under GINA. More information about GINA can be found by visiting [email or web address].  The Tyrer-Cuzick model is one of multiple prediction models developed to estimate an individual's lifetime risk of developing breast cancer. The Tyrer-Cuzick model is endorsed by the [employer] (NCCN). This model includes many risk factors such as family history, endogenous estrogen exposure, and benign breast disease. The calculation is highly-dependent on the accuracy of clinical data provided by the patient and can change over time. The Tyrer-Cuzick model may be repeated to reflect new information in her personal or family history in the future. This model will be run if results are uninformative.   PLAN: After considering the risks, benefits, and limitations, Ms. Wade provided informed consent to pursue genetic testing and the blood sample was sent to [facility] for analysis of the CancerNext +RNAinsight test. Results should be available within approximately 2-3 weeks' time, at which point they will be disclosed by telephone to Ms. Milich, as will any additional recommendations warranted by these results. Ms. Tyson will receive a summary of her genetic counseling visit and a copy of  her results once available. This information will also be available in Epic.   Based on Ms. Carlisi's family history, we recommended her sister, who was diagnosed with breast at age 31, have genetic counseling and testing.   Lastly, we encouraged Ms. Vanbrunt to remain in contact with cancer genetics annually so that we can continuously update the family history and inform her of any changes in cancer genetics and testing that may be of benefit for this family.   Ms. Pessolano questions were answered to her satisfaction today. Our contact information was provided should additional questions or concerns arise. Thank you for the referral and allowing us  to share in the care of your patient.   Jobie Mulders, MS, [facility] Licensed, Retail [email or web address] phone: [phone]  55 minutes were spent on the date of the encounter in service to the patient including preparation, face-to-face consultation, documentation and care coordination.  The patient was seen alone.   Drs. Maryalice Smaller, Iruku, and/or  Gudena were available for questions, if needed..   _______________________________________________________________________ For Office Staff:  Number of people involved in session: 1 Was an Intern/ student involved with case: no

## 2024-01-11 ENCOUNTER — Telehealth: Payer: Self-pay | Admitting: Genetic Counselor

## 2024-01-11 ENCOUNTER — Ambulatory Visit: Payer: Self-pay | Admitting: Genetic Counselor

## 2024-01-11 DIAGNOSIS — Z1379 Encounter for other screening for genetic and chromosomal anomalies: Secondary | ICD-10-CM | POA: Insufficient documentation

## 2024-01-11 NOTE — Progress Notes (Signed)
 HPI:  Kim Boyle was previously seen in the [facility] due to a family history of cancer and concerns regarding a hereditary predisposition to cancer. Please refer to our prior cancer genetics clinic note for more information regarding our discussion, assessment and recommendations, at the time. Kim Boyle recent genetic test results were disclosed to her, as were recommendations warranted by these results. These results and recommendations are discussed in more detail below.  FAMILY HISTORY:  We obtained a detailed, 4-generation family history.  Significant diagnoses are listed below: Family History  Problem Relation Age of Onset   Colon cancer Mother 58   Breast cancer Sister 28   Heart attack Brother 82   Breast cancer Paternal Grandmother 68 - 39   Colon cancer Maternal Aunt 53    Kim Boyle is unaware of previous family history of genetic testing for hereditary cancer risks. Kim Boyle reports her sister was diagnosed with breast cancer at age 38, living at age 64. She reports her mother was diagnosed with colon cancer in her early 88s, passed away at age 50. She reports her maternal aunt was diagnosed with colon cancer at age 93, passed away at 36. She reports her paternal grandmother was diagnosed with breast cancer in her 30s. There is no reported Ashkenazi Jewish ancestry.      GENETIC TEST RESULTS: Genetic testing reported out on 01/06/24 through the CancerNext +RNAinsight panel found no pathogenic mutations. The [facility] CancerNext+RNAinsight Panel includes sequencing, rearrangement analysis, and RNA analysis for the following 40 genes: APC, ATM, BAP1, BARD1, BMPR1A, BRCA1, BRCA2, BRIP1, CDH1, CDKN2A, CHEK2, FH, FLCN, MET, MLH1, MSH2, MSH6, MUTYH, NF1, NTHL1, PALB2, PMS2, PTEN, RAD51C, RAD51D, RPS20, SMAD4, STK11, TP53, TSC1, TSC2 and VHL (sequencing and deletion/duplication); AXIN2, HOXB13, MBD4, MSH3, POLD1 and POLE (sequencing only); EPCAM and GREM1  (deletion/duplication only). The test report has been scanned into EPIC and is located under the Molecular Pathology section of the Results Review tab.  A portion of the result report is included below for reference.     We discussed with Kim Boyle that because current genetic testing is not perfect, it is possible there may be a gene mutation in one of these genes that current testing cannot detect, but that chance is small.  We also discussed, that there could be another gene that has not yet been discovered, or that we have not yet tested, that is responsible for the cancer diagnoses in the family. It is also possible there is a hereditary cause for the cancer in the family that Kim Boyle did not inherit and therefore was not identified in her testing.  Therefore, it is important to remain in touch with cancer genetics in the future so that we can continue to offer Kim Boyle the most up to date genetic testing.   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  At this time, it is unknown if this variant is associated with increased cancer risk or if this is a normal finding, but most variants such as this get reclassified to being inconsequential. It should not be used to make medical management decisions. With time, we suspect the lab will determine the significance of this variant, if any. If we do learn more about it, we will try to contact Kim Boyle to discuss it further. However, it is important to stay in touch with us  periodically and keep the address and phone number up  to date.  ADDITIONAL GENETIC TESTING: We discussed with Kim Boyle that there are other genes that are associated with increased cancer risk that can be analyzed. Should Kim Boyle wish to pursue additional genetic testing, we are happy to discuss and coordinate this testing, at any time.    CANCER SCREENING RECOMMENDATIONS: [name] test result is considered negative (normal).  This means that we have not identified a hereditary cause for her family history of cancer at this time. Most cancers happen by chance and this negative test suggests that her family history of cancer may fall into this category.    Possible reasons for Kim Boyle's negative genetic test include:  1. There may be a gene mutation in one of these genes that current testing methods cannot detect but that chance is small.  2. There could be another gene that has not yet been discovered, or that we have not yet tested, that is responsible for the cancer diagnoses in the family.  3.  There may be no hereditary risk for cancer in the family. The cancers in Kim Boyle and/or her family may be sporadic/familial or due to other genetic and environmental factors. 4. It is also possible there is a hereditary cause for the cancer in the family that Kim Boyle did not inherit.  Therefore, it is recommended she continue to follow the cancer management and screening guidelines provided by her primary healthcare provider. An individual's cancer risk and medical management are not determined by genetic test results alone. Overall cancer risk assessment incorporates additional factors, including personal medical history, family history, and any available genetic information that may result in a personalized plan for cancer prevention and surveillance  Given Kim Boyle's family history, we must interpret these negative results with some caution.  Families with features suggestive of hereditary risk for cancer tend to have multiple family members with cancer, diagnoses in multiple generations and diagnoses before the age of 33. Kim Boyle family exhibits some of these features. Thus, this result may simply reflect our current inability to detect all mutations within these genes or there may be a different gene that has not yet been discovered or tested.   An  individual's cancer risk and medical management are not determined by genetic test results alone. Overall cancer risk assessment incorporates additional factors, including personal medical history, family history, and any available genetic information that may result in a personalized plan for cancer prevention and surveillance.  Based on Kim Boyle's family of cancer, as well as her genetic test results, statistical models (Tyrer Reva Castleman, [employer])  were used to estimate her risk of developing breast. This estimates her lifetime risk of developing breast to be approximately 8.1%  The patient's lifetime breast cancer risk is a preliminary estimate based on available information using the Tyrer Cuzick/IBIS model endorsed by the [employer] (NCCN).  The NCCN recommends consideration of breast MRI screening as an adjunct to mammography for patients at high risk (defined as 20% or greater lifetime risk). The Gregary Lean model estimates a 5 year risk for breast cancer, used to help make decisions on risk reducing medications. Kim Boyle 5 year breast cancer risk was estimated at 3.6%. NCCN recommends consideration for risk reducing medication when 5 year risk is >1.67%. Please note that a woman's breast cancer risk changes over time. It may increase or decrease based on age and any changes to the personal and/or family medical history. The risks and recommendations listed above apply to  this patient at this point in time. In the future, she may or may not be eligible for the same medical management strategies and, in some cases, other medical management strategies may become available to her. If she is interested in an updated breast cancer risk assessment at a later date, she can contact us .   Ms. Pisani may have an increased risk for breast cancer. The Gregary Lean model has calculated her 5 year risk for breast cancer to be 3.6%. NCCN recommends that women with a 5-year risk for breast cancer over  1.7% per Gregary Lean model:  Clinical encounter every 6-12 months, to begin when increased risk is identified  Annual screening mammogram with tomosynthesis  To begin when identified as being at increased risk  Consider risk reduction strategies, such as medication to decrease the risk for breast cancer  Breast awareness     We, therefore, discussed that it is reasonable for Ms. Stankovic to be followed by a high-risk breast cancer clinic; in addition to a yearly mammogram and physical exam by a healthcare provider, she should discuss the usefulness of risk reducing medication with the high-risk clinic providers. She will consider this option and notify us  if interested in a referral.   Given Ms. Buttacavoli's family history of a first degree relative with colorectal cancer, NCCN recommended colonoscopy start at age 35 or 10 years before the first age of colorectal cancer in the family (whichever comes first), and should be repeated at least every five years.   RECOMMENDATIONS FOR FAMILY MEMBERS:  Individuals in this family might be at some increased risk of developing cancer, over the general population risk, simply due to the family history of cancer.  We recommended women in this family have a yearly mammogram beginning at age 61, or 46 years younger than the earliest onset of cancer, an annual clinical breast exam, and perform monthly breast self-exams. Women in the family should have a breast cancer risk evaluation to determine if additional screening or risk reducing medication is recommended. Women in this family should also have a gynecological exam as recommended by their primary provider. Family members with a parent, sibling or child with colorectal cancer should start colonoscopy at age 56 or 10 years before the first age of colorectal cancer in the family (whichever comes first), and should be repeated at least every five years. Other family members should be referred for colonoscopy starting at age  16.  It is also possible there is a hereditary cause for the cancer in Ms. Lang's family that she did not inherit and therefore was not identified in her.  Based on Ms. Pau's family history, we recommended her sister, who was diagnosed with breast at age 44, have genetic counseling and testing. Ms. Gaul will let us  know if we can be of any assistance in coordinating genetic counseling and/or testing for this family member.   FOLLOW-UP: Lastly, we discussed with Ms. Blaker that cancer genetics is a rapidly advancing field and it is possible that new genetic tests will be appropriate for her and/or her family members in the future. We encouraged her to remain in contact with cancer genetics on an annual basis so we can update her personal and family histories and let her know of advances in cancer genetics that may benefit this family.   Our contact number was provided. Ms. Evers questions were answered to her satisfaction, and she knows she is welcome to call us  at anytime with additional questions or concerns.  Jobie Mulders, MS, [facility] Licensed, Retail [email or web address] [phone]

## 2024-01-11 NOTE — Telephone Encounter (Signed)
 I spoke to Kim Boyle to review results of genetic testing. She had genetic testing with [facility] CancerNext +RNAinsight panel. 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 We discussed that we do not use VUS to make management decisions or identify at risk family members. Discussed that we do not know why there is cancer in the family. It could be due to a different gene that we are not testing, or maybe our current technology may not be able to pick something up.  It will be important for her to keep in contact with genetics to keep up with whether additional testing may be needed. We will contact her if new information is learned about the VUS.   Please see counseling note for further detail on this result.

## 2024-02-12 ENCOUNTER — Telehealth: Payer: Self-pay | Admitting: Genetic Counselor

## 2024-02-12 ENCOUNTER — Encounter: Payer: Self-pay | Admitting: Genetic Counselor

## 2024-02-12 NOTE — Telephone Encounter (Signed)
 Received updated information from Ms. [place] regarding her family history of cancer. Updated below.   Family History  Problem Relation Age of Onset   Colon cancer Mother 66   Breast cancer Sister 87   Heart attack Brother 85   Breast cancer Maternal Grandmother 56   Breast cancer Paternal Grandmother 40 - 39   Breast cancer Maternal Aunt 29       bilateral   Colon cancer Maternal Aunt 50 - 59   Ovarian cancer Maternal Aunt 30 - 39   Skin cancer Maternal Aunt    Thyroid cancer Maternal Aunt     No further testing recommended (negative CancerNext +RNA 2025) unless maternal aunt had medullary thyroid cancer. Recommend testing for maternal aunt's children if they have not had testing previously.

## 2024-05-10 ENCOUNTER — Other Ambulatory Visit

## 2024-05-10 ENCOUNTER — Ambulatory Visit: Admitting: Physician Assistant

## 2024-05-13 ENCOUNTER — Telehealth: Payer: Self-pay | Admitting: Physician Assistant

## 2024-05-13 NOTE — Telephone Encounter (Signed)
 The patient called to cancel appointments and stated she would back to reschedule at a later time.

## 2024-05-15 ENCOUNTER — Ambulatory Visit: Admitting: Physician Assistant

## 2024-05-15 ENCOUNTER — Other Ambulatory Visit

## 2024-05-17 ENCOUNTER — Ambulatory Visit: Admitting: Physician Assistant

## 2024-05-17 ENCOUNTER — Other Ambulatory Visit
